# Patient Record
Sex: Female | Born: 1990 | Race: White | Hispanic: No | Marital: Single | Smoking: Never smoker
Health system: Southern US, Community
[De-identification: ages and names within clinical notes are randomized; demographics above are authoritative.]

---
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
## 2012-08-10 ENCOUNTER — Emergency Department: Payer: Self-pay | Admitting: Emergency Medicine

## 2013-09-20 ENCOUNTER — Ambulatory Visit (INDEPENDENT_AMBULATORY_CARE_PROVIDER_SITE_OTHER): Payer: BC Managed Care – PPO | Admitting: Family Medicine

## 2013-09-20 VITALS — BP 116/68 | HR 101 | Temp 98.8°F | Resp 18 | Ht 64.5 in | Wt 122.0 lb

## 2013-09-20 DIAGNOSIS — J0100 Acute maxillary sinusitis, unspecified: Secondary | ICD-10-CM

## 2013-09-20 DIAGNOSIS — R059 Cough, unspecified: Secondary | ICD-10-CM

## 2013-09-20 DIAGNOSIS — R05 Cough: Secondary | ICD-10-CM

## 2013-09-20 DIAGNOSIS — J069 Acute upper respiratory infection, unspecified: Secondary | ICD-10-CM

## 2013-09-20 DIAGNOSIS — F430 Acute stress reaction: Secondary | ICD-10-CM

## 2013-09-20 MED ORDER — AMOXICILLIN-POT CLAVULANATE 875-125 MG PO TABS: 1.0000 | ORAL_TABLET | Freq: Two times a day (BID) | ORAL | Status: DC

## 2013-09-20 MED ORDER — FLUTICASONE PROPIONATE 50 MCG/ACT NA SUSP: 2.0000 | Freq: Every day | NASAL | Status: DC

## 2013-09-20 MED ORDER — PROMETHAZINE-DM 6.25-15 MG/5ML PO SYRP: 5.0000 mL | ORAL_SOLUTION | Freq: Every evening | ORAL | Status: DC | PRN

## 2013-09-20 NOTE — Progress Notes (Signed)
Subjective:    Patient ID: Brittany Khan, female    DOB: 1990/11/03, 23 y.o.   MRN: [id number] This chart was scribed for Ethelda ChickKristi M Damica Gravlin, MD by Valera CastleSteven Perry, ED Scribe. This patient was seen in room 09 and the patient's care was started at 7:07 PM.  Chief Complaint  Patient presents with  . Cough    congestion-all symptoms started Friday  . Sore Throat    HPI Brittany Khan is a 23 y.o. female who [facility] complaining of fever, max temp 99.6, dry cough, sore throat, right ear pain, headache, sinus pressure, rhinorrhea, chills, and body aches, onset 6 days ago. She reports going to the doctor the next day to test for flu. She reports negative flu results, but states they told her she had virus. She reports receiving Tessalon pearls for cough, 1 pill/3xday. She states her coughing has improved during the day, but states that it has still kept her up in the evenings. She reports not being able to fall asleep for several hours after going to bed. She reports her chills and body aches have subsided. She reports taking Advil cold and sinus and Nyquil for her other symptoms, with no relief. She denies congestion, painful swallowing, diarrhea, wheezing, SOB, rash, vomiting, and any other associated symptoms. She denies having flu immunization. She denies h/o allergies. She denies having success with Amoxicillin. She reports doing well with Flonase.   She states she works at [employer]. She reports not doing well with her parents recent divorce. She states she has been hanging in there, but reports feeling sad and stressed. She denies being sad all the time, just when she has time to herself. She denies SI. She thinks that her cold symptoms may be due to the stress. She reports her parents got divorced in 06/2013. She reports seeing [facility] J[facility] Heinz[facility]yl Rosser. She reports living with her father. She states her father tends to be emotionally reserved, and doesn't talk to her much about it.   She reports  she may have Endometrioses. She states she sees Dr. Arelia SneddonMcComb, and reports having US done. She reports having back pain, and being put on Depo Provera, with relief. She reports being on it for about 6 months, stating she will have her 3rd injection this month.   PCP - No PCP Per Patient  There are no active problems to display for this patient.  History reviewed. No pertinent past medical history. Past Surgical History  Procedure Laterality Date  . Knee arthroscopy w/ debridement Right 2012   No Known Allergies Prior to Admission medications   Medication Sig Start Date End Date Taking? Authorizing Provider  benzonatate (TESSALON) 100 MG capsule Take by mouth 3 (three) times daily as needed for cough.   Yes Historical Provider, MD   History   Social History  . Marital Status: Single    Spouse Name: N/A    Number of Children: N/A  . Years of Education: N/A   Occupational History  . Not on file.   Social History Main Topics  . Smoking status: Never Smoker   . Smokeless tobacco: Not on file  . Alcohol Use: No  . Drug Use: No  . Sexual Activity: Not on file   Other Topics Concern  . Not on file   Social History Narrative   Education: on-line courses currently; [place] school in [place] ([employer]); Public affairs consultantgetting vet tech certification.      Employment: working at [employer] 20 hours per week since 2013.    Review of Systems  Constitutional: Positive for fever and chills.  HENT: Positive for ear pain (right), rhinorrhea, sinus pressure and sore throat. Negative for congestion and trouble swallowing.   Respiratory: Positive for cough. Negative for shortness of breath and wheezing.   Gastrointestinal: Negative for vomiting and diarrhea.  Musculoskeletal: Positive for myalgias (body aches). Negative for back pain.  Skin: Negative for rash.  Allergic/Immunologic: Negative.   Neurological: Positive for headaches.  Psychiatric/Behavioral: Positive for sleep  disturbance and dysphoric mood (parents divorce). Negative for suicidal ideas.       Objective:   Physical Exam  Nursing note and vitals reviewed. Constitutional: She is oriented to person, place, and time. She appears well-developed and well-nourished. No distress.  HENT:  Head: Normocephalic and atraumatic.  Right Ear: External ear normal.  Left Ear: External ear normal.  Mouth/Throat: Uvula is midline and mucous membranes are normal. Posterior oropharyngeal erythema (diffuse) present. No oropharyngeal exudate or posterior oropharyngeal edema.  Eyes: EOM are normal.  Neck: Neck supple. No thyromegaly present.  Cardiovascular: Normal rate, regular rhythm and normal heart sounds.   No murmur heard. Pulmonary/Chest: Effort normal and breath sounds normal. No respiratory distress. She has no wheezes. She has no rales.  Musculoskeletal: Normal range of motion.  Lymphadenopathy:    She has cervical adenopathy (mild).  Neurological: She is alert and oriented to person, place, and time.  Skin: Skin is warm and dry.  Psychiatric: She has a normal mood and affect. Her behavior is normal.   BP 116/68  Pulse 101  Temp(Src) 98.8 F (37.1 C) (Oral)  Resp 18  Ht 5' 4.5" (1.638 m)  Wt 122 lb (55.339 kg)  BMI 20.63 kg/m2  SpO2 99%     Assessment & Plan:   1. Acute maxillary sinusitis   2. Acute upper respiratory infections of unspecified site   3. Cough   4. Stress reaction     1. Acute maxillary sinusitis:  New.  Rx for Augmentin, Tessalon Perles, Flonase provided. 2.  URI:  New.  Etiology of sinusitis; rx for [employer], Flonase provided.  Promethazine DM provided as well. 3. Acute stress reaction: New. Coping with stress of parents' divorce; counseling provided.   Meds ordered this encounter  Medications  . DISCONTD: benzonatate (TESSALON) 100 MG capsule    Sig: Take by mouth 3 (three) times daily as needed for cough.  . DISCONTD: amoxicillin-clavulanate (AUGMENTIN)  875-125 MG per tablet    Sig: Take 1 tablet by mouth 2 (two) times daily.    Dispense:  20 tablet    Refill:  0  . fluticasone (FLONASE) 50 MCG/ACT nasal spray    Sig: Place 2 sprays into both nostrils daily.    Dispense:  16 g    Refill:  0  . DISCONTD: promethazine-dextromethorphan (PROMETHAZINE-DM) 6.25-15 MG/5ML syrup    Sig: Take 5 mLs by mouth at bedtime as needed for cough.    Dispense:  120 mL    Refill:  0    I personally performed the services described in this documentation, which was scribed in my presence.  The recorded information has been reviewed and is accurate.  Nilda Simmer, M.D.  [facility] [address] [place], [place]  [address] [phone] phone [phone] fax

## 2013-09-20 NOTE — Patient Instructions (Signed)

## 2013-12-17 ENCOUNTER — Encounter: Payer: BC Managed Care – PPO | Admitting: Family Medicine

## 2013-12-20 ENCOUNTER — Ambulatory Visit (INDEPENDENT_AMBULATORY_CARE_PROVIDER_SITE_OTHER): Payer: BC Managed Care – PPO | Admitting: Family Medicine

## 2013-12-20 ENCOUNTER — Encounter: Payer: Self-pay | Admitting: Family Medicine

## 2013-12-20 VITALS — BP 130/66 | HR 73 | Temp 98.6°F | Resp 16 | Ht 62.0 in | Wt 124.0 lb

## 2013-12-20 DIAGNOSIS — G4700 Insomnia, unspecified: Secondary | ICD-10-CM

## 2013-12-20 DIAGNOSIS — F411 Generalized anxiety disorder: Secondary | ICD-10-CM

## 2013-12-20 DIAGNOSIS — F410 Panic disorder [episodic paroxysmal anxiety] without agoraphobia: Secondary | ICD-10-CM

## 2013-12-20 MED ORDER — TRAZODONE HCL 50 MG PO TABS: 50.0000 mg | ORAL_TABLET | Freq: Every day | ORAL | Status: DC

## 2013-12-20 MED ORDER — ESCITALOPRAM OXALATE 10 MG PO TABS: 10.0000 mg | ORAL_TABLET | Freq: Every day | ORAL | Status: DC

## 2013-12-20 NOTE — Progress Notes (Signed)
Subjective:    Patient ID: Brittany Khan, female    DOB: [birth year], 23 y.o.   MRN: [id number]  Chief Complaint  Patient presents with  . Anxiety  . Stress  . Insomnia   HPI This chart was scribed for Ashlye Oviedo-MD by Smiley HousemanFallon Davis, Scribe. This patient was seen in room 9 and the patient's care was started at 3:42 PM.  HPI Comments: Brittany Khan is a 23 y.o. female who [facility] complaining of gradually worsening insomnia.  Pt reports, "It has been rough lately and is keeping her up at night."  She states she has been staying up to 3:00 AM every night.  She states she then sleeps in till 10:00 AM, unless she has to take her brother to school.  She states she has been extremely moody lately.  Pt's parents separated last year.  She states she lashed out at her mom this week, which is very abnormal for her.  Pt reports her mother recently started dating someone, which makes her mad and upset.    Pt states she has developed anxiety and experiencing anxiety attacks since September 2014.  Pt states the attacks are triggered by loud noise and noises caused by eating.  Pt reports these attacks are causing her to stay at home and isolate herself.  Pt reports these attacks have worsened over time.  She states she was in [place] a few weeks ago and due to the chaos she shut down and couldn't think.  Pt also reports she drinks caffeine and doesn't exercise like she should.  She states she sometimes walks between her parents homes.  Pt reports she is currently working about 20 hours a week at the [employer].  She states she has been working there for almost 2 years.    Pt states she is currently enrolled in online classes at [employer].  Pt states her grades are good and haven't been affected by the stress.  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  Pt states she is stressed about [place] to [place] and  finding an apartment.  She states she will have a roommate and has been in contact with her over the past couple of months.  She denies living alone before.   Pt is currently living with her dad and states they are getting along pretty well.  Pt denies dating anyone.  She states she dated someone for 2 years, but recently ended the relationship.  Pt states it has been difficult dealing with the break-up.  She denies homicidal or suicidal thoughts.       Past Surgical History  Procedure Laterality Date  . Knee arthroscopy w/ debridement Right 2012    Family History  Problem Relation Age of Onset  . Hyperlipidemia Father   . Stroke Maternal Grandmother   . Heart disease Maternal Grandfather   . Cancer Paternal Grandfather     History   Social History  . Marital Status: Single    Spouse Name: N/A    Number of Children: N/A  . Years of Education: N/A   Occupational History  . Not on file.   Social History Main Topics  . Smoking status: Never Smoker   . Smokeless tobacco: Not on file  . Alcohol Use: No  . Drug Use: No  . Sexual Activity: Not on file   Other Topics Concern  .  Not on file   Social History Narrative  . No narrative on file    No Known Allergies  There are no active problems to display for this patient.  Review of Systems  Constitutional: Negative for fever and chills.  Gastrointestinal: Negative for nausea and vomiting.  Skin: Negative for color change and rash.  Neurological: Negative for headaches.  Psychiatric/Behavioral: Positive for sleep disturbance. Negative for suicidal ideas, behavioral problems, confusion and self-injury. The patient is nervous/anxious.     Objective:   Physical Exam  Nursing note and vitals reviewed. Constitutional: She is oriented to person, place, and time. She appears well-developed and well-nourished. No distress.  HENT:  Head: Normocephalic and atraumatic.  Eyes: EOM are normal.  Neck: Neck supple. No tracheal  deviation present. No thyromegaly present.  Cardiovascular: Normal rate, regular rhythm and normal heart sounds.  Exam reveals no gallop and no friction rub.   No murmur heard. Pulmonary/Chest: Effort normal and breath sounds normal. No respiratory distress. She has no wheezes. She has no rales.  Abdominal: Soft. She exhibits no distension.  Musculoskeletal: Normal range of motion.  Lymphadenopathy:    She has no cervical adenopathy.  Neurological: She is alert and oriented to person, place, and time.  Skin: Skin is warm and dry. No rash noted.  Psychiatric: She has a normal mood and affect. Her speech is normal and behavior is normal. Judgment and thought content normal. Cognition and memory are normal. She expresses no homicidal and no suicidal ideation.   Filed Vitals:   12/20/13 1520  BP: 130/66  Pulse: 73  Temp: 98.6 F (37 C)  TempSrc: Oral  Resp: 16  Height: 5\' 2"  (1.575 m)  Weight: 124 lb (56.246 kg)  SpO2: 99%   DIAGNOSTIC STUDIES: Oxygen Saturation is 99% on RA, normal by my interpretation.    COORDINATION OF CARE: 4:03 PM-Patient informed of current plan of treatment and evaluation and agrees with plan.       Assessment & Plan:  Panic attacks  Generalized anxiety disorder  Insomnia  1.  Panic attacks:  New. Secondary to underlying anxiety; rx for Lexapro 10mg  daily provided.  Recommend caffeine avoidance and daily exercise. Will likely warrant up-titration of Lexapro at next visit. 2. Generalized anxiety disorder:  New.  Rx for Lexapro; recommend starting psychotherapy and daily exercise.  Pt to contact therapist. 3.  Insomnia:  New. Rx for Trazodone 50mg  1-2 qhs; avoid caffeine in the evenings.    Meds ordered this encounter  Medications  . escitalopram (LEXAPRO) 10 MG tablet    Sig: Take 1 tablet (10 mg total) by mouth daily.    Dispense:  30 tablet    Refill:  5  . traZODone (DESYREL) 50 MG tablet    Sig: Take 1-2 tablets (50-100 mg total) by mouth at  bedtime.    Dispense:  45 tablet    Refill:  3    I personally performed the services described in this documentation, which was scribed in my presence.  The recorded information has been reviewed and is accurate.  Nilda SimmerKristi Raima Geathers, M.D.  [facility]  [place] [address] [place], [place]  [id number] [phone] phone [phone](336) [phone] fax

## 2013-12-20 NOTE — Patient Instructions (Signed)
1.  Call Elita Quickheryl Lawson for an appointment. 2.  Start walking daily. 3.  Decrease caffeine intake; avoid caffeine after 5:00pm. 4.  Start lexapro 10mg  1/2 tablet daily for two weeks and then increase to 1 tablet daily. 5.  Take Trazodone 50mg  1 at bedtime for insomnia; take one hour before going to bed. If one tablet does not work, you can increase to 2 tablets at bedtime for insomnia. 6.  Call if you have any problems with these medications.  Generalized Anxiety Disorder Generalized anxiety disorder (GAD) is a mental disorder. It interferes with life functions, including relationships, work, and school. GAD is different from normal anxiety, which everyone experiences at some point in their lives in response to specific life events and activities. Normal anxiety actually helps us prepare for and get through these life events and activities. Normal anxiety goes away after the event or activity is over.  GAD causes anxiety that is not necessarily related to specific events or activities. It also causes excess anxiety in proportion to specific events or activities. The anxiety associated with GAD is also difficult to control. GAD can vary from mild to severe. People with severe GAD can have intense waves of anxiety with physical symptoms (panic attacks).  SYMPTOMS The anxiety and worry associated with GAD are difficult to control. This anxiety and worry are related to many life events and activities and also occur more days than not for 6 months or longer. People with GAD also have three or more of the following symptoms (one or more in children):  Restlessness.   Fatigue.  Difficulty concentrating.   Irritability.  Muscle tension.  Difficulty sleeping or unsatisfying sleep. DIAGNOSIS GAD is diagnosed through an assessment by your caregiver. Your caregiver will ask you questions aboutyour mood,physical symptoms, and events in your life. Your caregiver may ask you about your medical history  and use of alcohol or drugs, including prescription medications. Your caregiver may also do a physical exam and blood tests. Certain medical conditions and the use of certain substances can cause symptoms similar to those associated with GAD. Your caregiver may refer you to a mental health specialist for further evaluation. TREATMENT The following therapies are usually used to treat GAD:   Medication Antidepressant medication usually is prescribed for long-term daily control. Antianxiety medications may be added in severe cases, especially when panic attacks occur.   Talk therapy (psychotherapy) Certain types of talk therapy can be helpful in treating GAD by providing support, education, and guidance. A form of talk therapy called cognitive behavioral therapy can teach you healthy ways to think about and react to daily life events and activities.  Stress managementtechniques These include yoga, meditation, and exercise and can be very helpful when they are practiced regularly. A mental health specialist can help determine which treatment is best for you. Some people see improvement with one therapy. However, other people require a combination of therapies. Document Released: 12/18/2012 Document Reviewed: 12/18/2012 [facility] Patient Information 2014 [employer] [place].

## 2013-12-24 NOTE — Progress Notes (Signed)
Left a message for patient to return call for appointment

## 2013-12-26 NOTE — Progress Notes (Signed)
Left a message for patient to return call for 1 month follow up appointment with Dr. Katrinka BlazingSmith,

## 2014-01-14 ENCOUNTER — Encounter: Payer: Self-pay | Admitting: Family Medicine

## 2014-01-14 ENCOUNTER — Ambulatory Visit (INDEPENDENT_AMBULATORY_CARE_PROVIDER_SITE_OTHER): Payer: BC Managed Care – PPO | Admitting: Family Medicine

## 2014-01-14 VITALS — BP 96/60 | HR 60 | Resp 18 | Ht 64.0 in | Wt 118.0 lb

## 2014-01-14 DIAGNOSIS — F410 Panic disorder [episodic paroxysmal anxiety] without agoraphobia: Secondary | ICD-10-CM

## 2014-01-14 DIAGNOSIS — F419 Anxiety disorder, unspecified: Principal | ICD-10-CM

## 2014-01-14 DIAGNOSIS — F341 Dysthymic disorder: Secondary | ICD-10-CM

## 2014-01-14 DIAGNOSIS — F329 Major depressive disorder, single episode, unspecified: Secondary | ICD-10-CM

## 2014-01-14 DIAGNOSIS — F32A Depression, unspecified: Secondary | ICD-10-CM

## 2014-01-14 DIAGNOSIS — G4700 Insomnia, unspecified: Secondary | ICD-10-CM

## 2014-01-14 NOTE — Progress Notes (Signed)
Subjective:  This chart was scribed for  Brittany SimmerKristi Verne Cove, MD  by Ashley JacobsBrittany Andrews, [facility] Scribe. The patient was seen in room and the patient's care was started at 10:09 AM.   Patient ID: Brittany Khan, female    DOB: 23-Jul-1991, 23 y.o.   MRN: [id number]  HPI HPI Comments: Brittany DelCelina N Hazen is a 23 y.o. female who [facility] a follow up after being seen one month ago for anxiety. She reports feeling 15% better since her last visit. Pt was having 2-3 panic attacks a week and now she is having one a week. Pt was prescribed Lexapro 10 mg. She had a panic attack two weeks ago. Pt is staying with her mother and she is less stressed/ "paranoid". [facility] because she is working close to full time. She has intermittent dysphoric moods.Pt feels more energized when taking Lexapro so she takes it in the morning or afternoon. She denies having an appetite. Pt is on the Depo Provera. She lost weight but she reports this occurred the last time she was in a stressful event. The Trazodone helps her to sleep at night but she only takes it when does not have to wake early in the morning. The night when she does not take it she wakes in the middle of the night. Denies SI thought. She has decreased her caffeine intakes. She has caffeine. Pt is active outside and walk her dogs daily. She is moving to [place] and has a new job in either late June or early July.  She is happy about this move.   Review of Systems  Constitutional: Positive for appetite change and unexpected weight change. Negative for fever, chills, diaphoresis and fatigue.  Gastrointestinal: Negative for nausea and vomiting.  Psychiatric/Behavioral: Positive for sleep disturbance. Negative for suicidal ideas, behavioral problems, confusion, self-injury, dysphoric mood, decreased concentration and agitation. The patient is nervous/anxious.        Objective:   Physical Exam    Constitutional: She is oriented to person, place, and time. She appears well-developed and well-nourished. No distress.  HENT:  Head: Normocephalic and atraumatic.  Eyes: Conjunctivae and EOM are normal. Pupils are equal, round, and reactive to light.  Neck: Normal range of motion. Neck supple.  Cardiovascular: Normal rate, regular rhythm and normal heart sounds.  Exam reveals no gallop and no friction rub.   No murmur heard. Pulmonary/Chest: Effort normal and breath sounds normal. No respiratory distress. She has no wheezes. She has no rales.  Abdominal: Soft. Bowel sounds are normal. She exhibits no distension and no mass. There is no tenderness. There is no rebound and no guarding.  Neurological: She is alert and oriented to person, place, and time. No cranial nerve deficit. She exhibits normal muscle tone. Coordination normal.  Skin: Skin is warm and dry. She is not diaphoretic.  Psychiatric: She has a normal mood and affect. Her behavior is normal. Judgment and thought content normal.  Nursing note and vitals reviewed.       Assessment & Plan:  Anxiety and depression  Panic attack  Insomnia   1. Anxiety and depression: improving with Lexapro 10mg  daily; no change in management at this time.  [place] upcoming [place]; recommend establishing with therapist there; also consider establishing with new PCP in [place].  Otherwise, I will be happy to see her when she visits family.  Recommend follow-up one last  time before moving. 2. Panic attacks: improved from last visit; now having one panic attack per week on average. 3. Insomnia: persistent due to sporadic use of Trazodone; encourage regular Trazodone use qhs.   No orders of the defined types were placed in this encounter.   I personally performed the services described in this documentation, which was scribed in my presence.  The recorded information has been reviewed and is accurate.  Brittany SimmerKristi Deyona Soza,  M.D.  [facility]  [place] [address] [place], [place]  [id number] [phone] phone [phone] fax

## 2014-02-13 ENCOUNTER — Ambulatory Visit: Payer: BC Managed Care – PPO | Admitting: Family Medicine

## 2014-02-27 ENCOUNTER — Ambulatory Visit: Payer: BC Managed Care – PPO | Admitting: Family Medicine

## 2014-03-27 ENCOUNTER — Ambulatory Visit: Payer: BC Managed Care – PPO | Admitting: Family Medicine

## 2014-06-04 IMAGING — CT CT HEAD WITHOUT CONTRAST
2 series · 16 of 30 positions shown, 20 images · non-contrast
Comparison: none

REASON FOR EXAM: fall
COMMENTS:

[Series 2: without · axial · non-contrast · 0.40mm/px · z∈[+59,+179]mm · 13 of 29 slices shown, 17 images]
[im 3/29  brain]
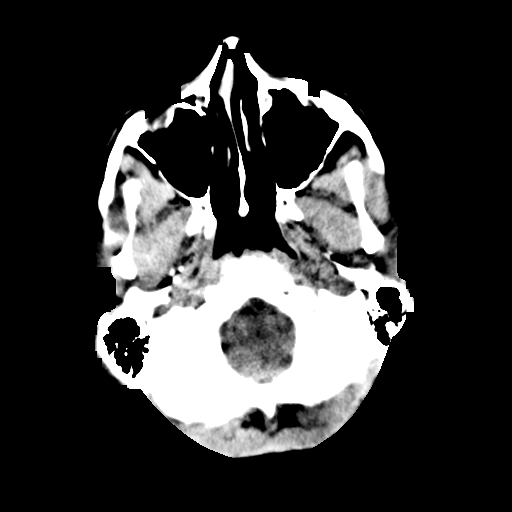
[im 3/29  bone]
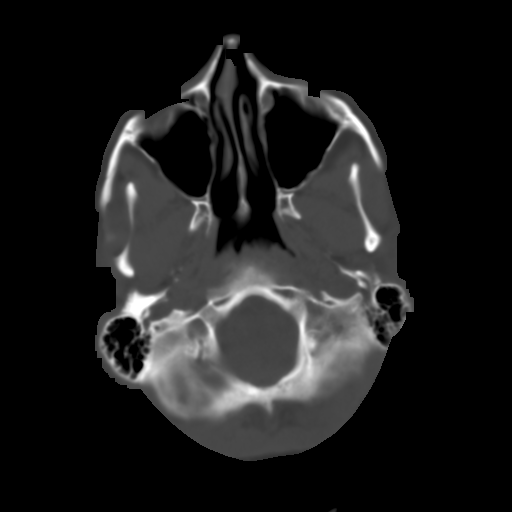
[im 5/29  brain]
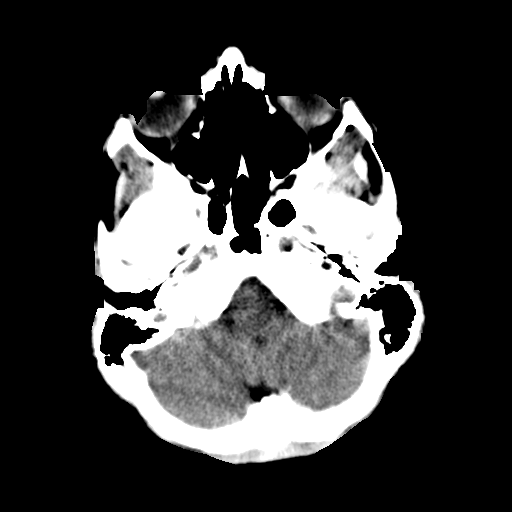
[im 7/29  brain]
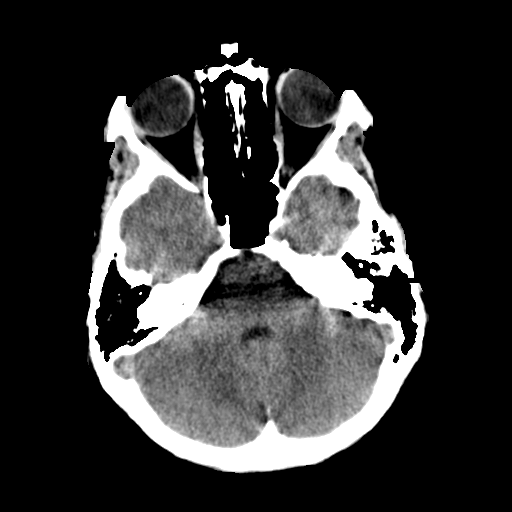
[im 9/29  brain]
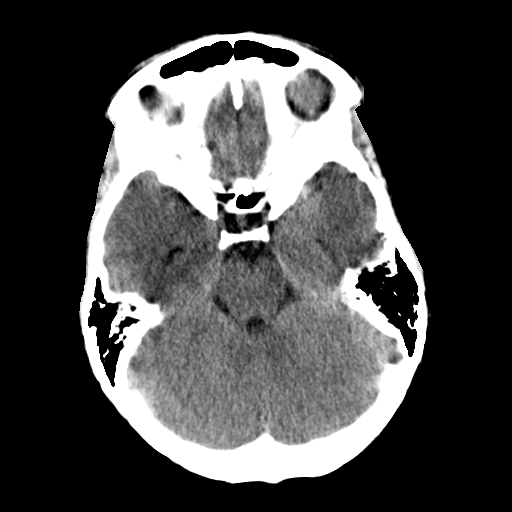
[im 11/29  brain]
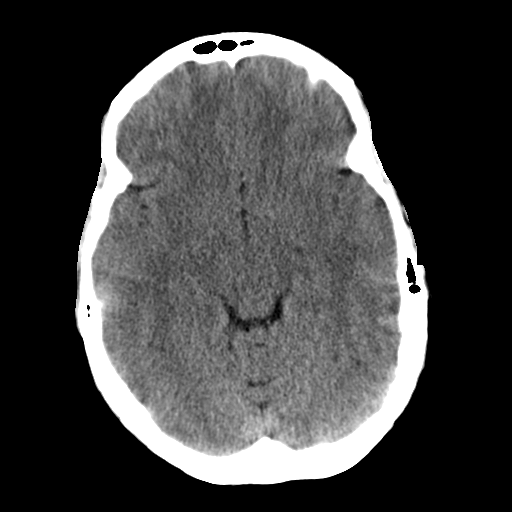
[im 11/29  bone]
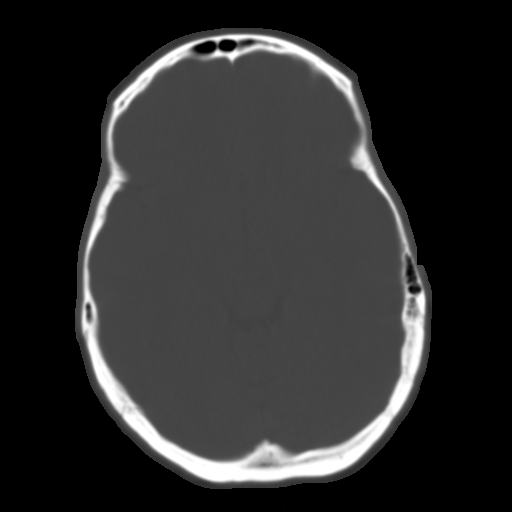
[im 13/29  brain]
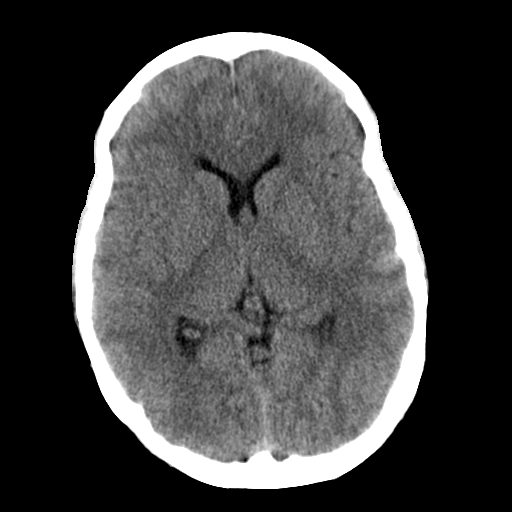
[im 15/29  brain]
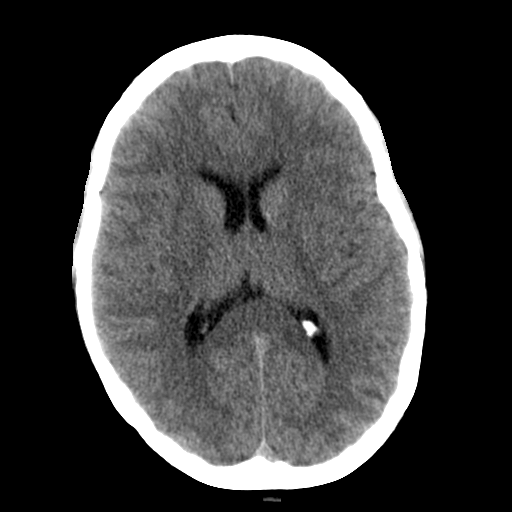
[im 17/29  brain]
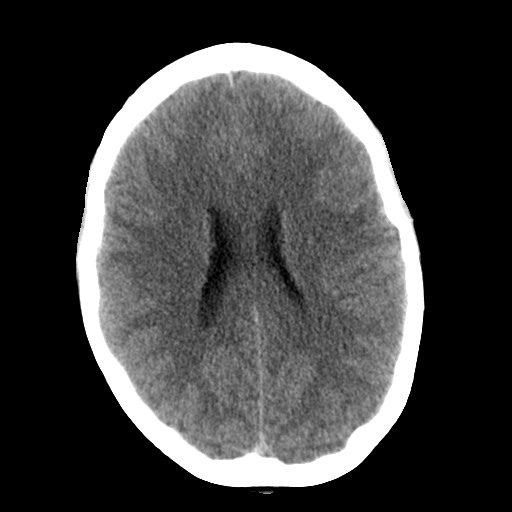
[im 19/29  brain]
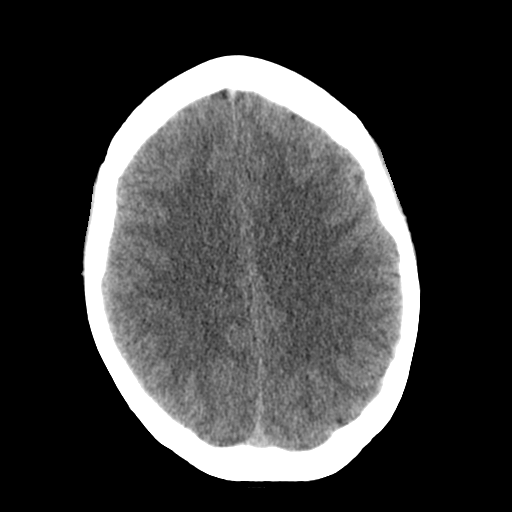
[im 19/29  bone]
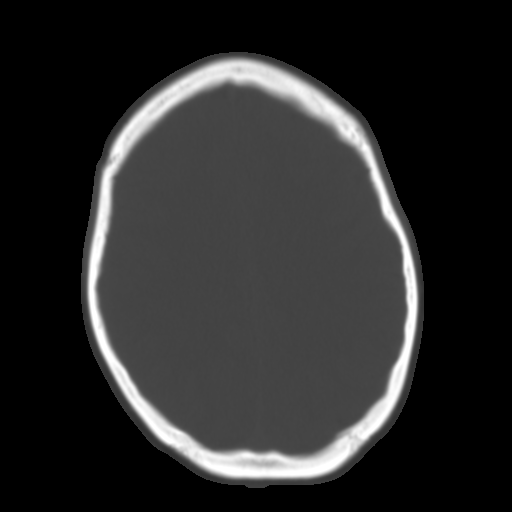
[im 21/29  brain]
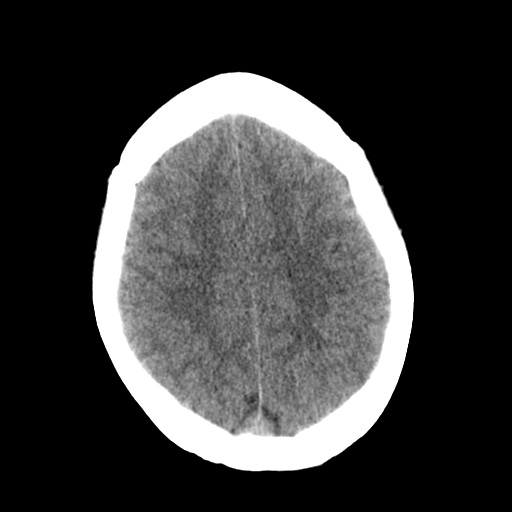
[im 23/29  brain]
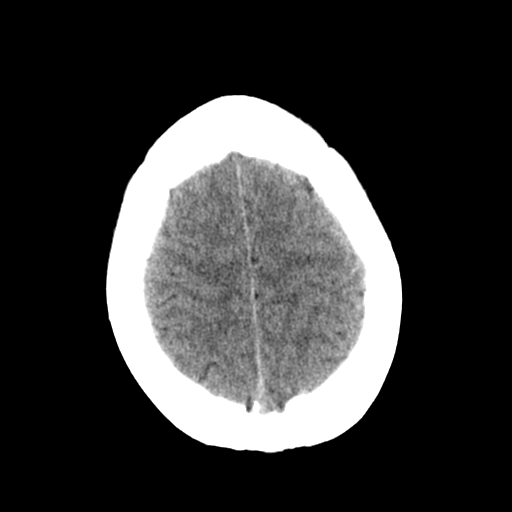
[im 25/29  brain]
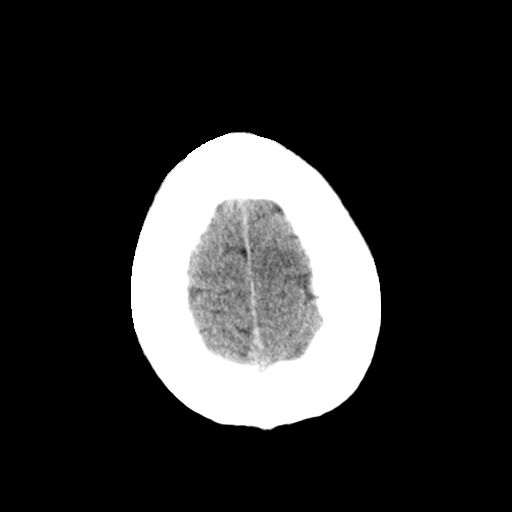
[im 27/29  brain]
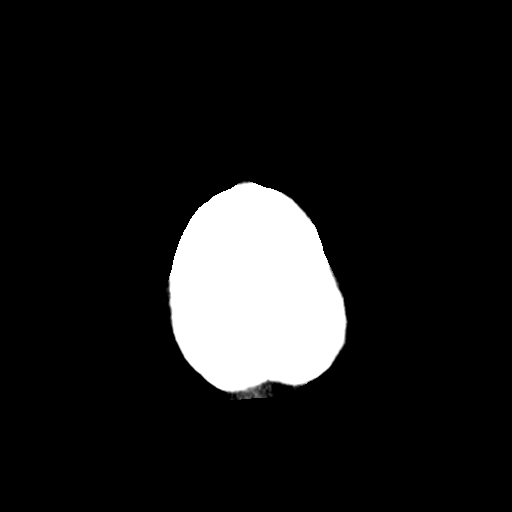
[im 27/29  bone]
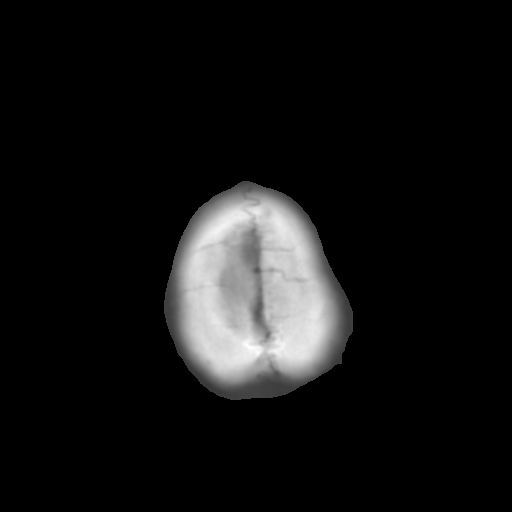

[Series 3: bone · axial · 0.40mm/px · z∈[+59,+99]mm · 3 of 29 slices shown]
[im 3/29  bone]
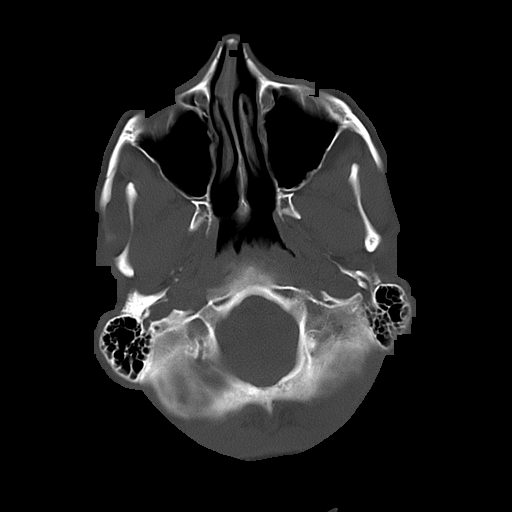
[im 7/29  bone]
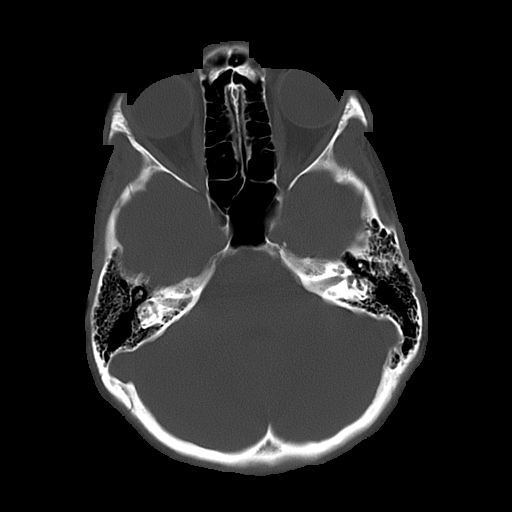
[im 11/29  bone]
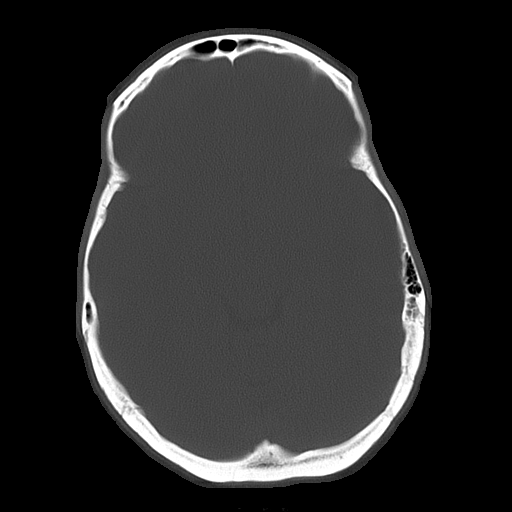

[16 of 30 positions shown; findings below may reference images not displayed]

PROCEDURE:     CT  - CT HEAD WITHOUT CONTRAST  - August 10, 2012  [DATE]

RESULT:     Axial noncontrast CT scanning was performed through the brain
with reconstructions at 5 mm intervals and slice thicknesses.

The ventricles are normal in size and position. There is no intracranial
hemorrhage nor intracranial mass effect. The cerebellum and brainstem are
normal in density. There is no evidence of an evolving ischemic event. At
bone window settings the observed portions of the paranasal sinuses and
mastoid air cells are clear. There is subtle soft tissue density on the
lower most image of the left maxillary sinus but this does not appear
inflammatory or posttraumatic. There is no evidence of an acute skull
fracture.
IMPRESSION: There is no acute intracranial abnormality.

[REDACTED]

## 2015-10-09 ENCOUNTER — Ambulatory Visit (INDEPENDENT_AMBULATORY_CARE_PROVIDER_SITE_OTHER): Payer: BLUE CROSS/BLUE SHIELD | Admitting: Family Medicine

## 2015-10-09 VITALS — BP 120/68 | HR 62 | Temp 99.2°F | Resp 16 | Ht 64.25 in | Wt 129.4 lb

## 2015-10-09 DIAGNOSIS — M545 Low back pain, unspecified: Secondary | ICD-10-CM

## 2015-10-09 DIAGNOSIS — R350 Frequency of micturition: Secondary | ICD-10-CM | POA: Diagnosis not present

## 2015-10-09 DIAGNOSIS — N390 Urinary tract infection, site not specified: Secondary | ICD-10-CM | POA: Diagnosis not present

## 2015-10-09 LAB — POCT WET + KOH PREP
TRICH BY WET PREP: ABSENT
YEAST BY WET PREP: ABSENT
Yeast by KOH: ABSENT

## 2015-10-09 LAB — POCT URINALYSIS DIP (MANUAL ENTRY)
Bilirubin, UA: NEGATIVE
Glucose, UA: NEGATIVE
Ketones, POC UA: NEGATIVE
Nitrite, UA: NEGATIVE
Protein Ur, POC: NEGATIVE
Spec Grav, UA: 1.01
UROBILINOGEN UA: 0.2
pH, UA: 6.5

## 2015-10-09 LAB — POC MICROSCOPIC URINALYSIS (UMFC): Mucus: ABSENT

## 2015-10-09 NOTE — Progress Notes (Signed)
Subjective:    Patient ID: Brittany Khan, female    DOB: 11-04-1990, 25 y.o.   MRN: [id number]  10/09/2015  Back Pain and Urinary Frequency   HPI This 25 y.o. female presents for evaluation of urinary frequency, back pain.  Checked urine at work; loaded with bacteria.  Started antibiotic/Bactrim eight days ago.  Still having back pain.  Fourth UTI since June.   Onset of symptoms early January; onset with hematuria; went to office; placed on Macrobid; stopped macrobid on 09/20/15. One week later, started having urinary frequency, dysuria; returned to doctor; still had UTI; placed on Bactrim at that time.  Has been taking since 09/24/15.  No fever but was taking Naproxen for back pain.  No chills/sweats.  No abdominal pain; mild nausea; no vomiting; no constipation or diarrhea.  No dysuria currently; no current gross hematuria; +frequency is every hour which is an improvement; nocturia x 1.  No vaginal discharge/burning/itching/irritation.  Sexual activity for the past one year.  Urine cultures every time presented to doctor's office but patient has not been notified; presented to Urgent Care in [place].  Back pain ongoing for a while/chronic; acutely worsens with UTI symptoms.  Depoprovera for contraception; just received in September 12, 2015.  [facility]  Has appointment with [facility] in three days.  STD screening three months ago.  Negative testing.    Checks urine culture with each UTI since July 2016.     Review of Systems  Constitutional: Negative for fever, chills, diaphoresis and fatigue.  Genitourinary: Positive for frequency and flank pain. Negative for dysuria, urgency, hematuria, decreased urine volume, vaginal bleeding, vaginal discharge, genital sores, vaginal pain, menstrual problem and pelvic pain.  Musculoskeletal: Positive for back pain.    No past medical history on file. Past Surgical History  Procedure Laterality Date  . Knee arthroscopy w/ debridement Right  2012   No Known Allergies  Social History   Social History  . Marital Status: Single    Spouse Name: N/A  . Number of Children: N/A  . Years of Education: N/A   Occupational History  . Not on file.   Social History Main Topics  . Smoking status: Never Smoker   . Smokeless tobacco: Not on file  . Alcohol Use: No  . Drug Use: No  . Sexual Activity: Yes    Birth Control/ Protection: Injection   Other Topics Concern  . 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      Employment: working at [employer] 20 hours per week since 2013.   Family History  Problem Relation Age of Onset  . Hyperlipidemia Father   . Stroke Maternal Grandmother   . Heart disease Maternal Grandfather   . Cancer Paternal Grandfather        Objective:    BP 120/68 mmHg  Pulse 62  Temp(Src) 99.2 F (37.3 C) (Oral)  Resp 16  Ht 5' 4.25" (1.632 m)  Wt 129 lb 6.4 oz (58.695 kg)  BMI 22.04 kg/m2  SpO2 98% Physical Exam  Constitutional: She is oriented to person, place, and time. She appears well-developed and well-nourished. No distress.  HENT:  Head: Normocephalic and atraumatic.  Eyes: Conjunctivae are normal. Pupils are equal, round, and reactive to light.  Neck: Normal range of motion. Neck supple.  Cardiovascular: Normal rate, regular rhythm and normal heart sounds.  Exam  reveals no gallop and no friction rub.   No murmur heard. Pulmonary/Chest: Effort normal and breath sounds normal. She has no wheezes. She has no rales.  Abdominal: Soft. Bowel sounds are normal. She exhibits no distension and no mass. There is no tenderness. There is no rebound and no guarding.  Genitourinary: Vagina normal and uterus normal. There is no rash, tenderness or lesion on the right labia. There is no rash, tenderness or lesion on the left labia. Cervix exhibits no motion  tenderness, no discharge and no friability. Right adnexum displays no mass, no tenderness and no fullness. Left adnexum displays no mass, no tenderness and no fullness. No vaginal discharge found.  Musculoskeletal:       Lumbar back: She exhibits pain. She exhibits normal range of motion, no tenderness, no bony tenderness and no spasm.  Neurological: She is alert and oriented to person, place, and time.  Skin: She is not diaphoretic.  Psychiatric: She has a normal mood and affect. Her behavior is normal.  Nursing note and vitals reviewed.       Assessment & Plan:   1. Recurrent UTI   2. Urinary frequency   3. Bilateral low back pain without sciatica    -recurrent symptoms of urinary frequency yet benign urine in office.  Send urine culture. -obtain GC/Chlam. -Rx for Naproxen provided for musculoskeletal etiology to lower back pain; recommend stretching, heat, NSAIDs. Avoid heavy llifting.   Orders Placed This Encounter  Procedures  . Urine culture  . GC/Chlamydia Probe Amp  . POCT Microscopic Urinalysis ([facility])  . POCT urinalysis dipstick  . POCT Wet + KOH Prep   Meds ordered this encounter  Medications  . naproxen (NAPROSYN) 500 MG tablet    Sig: Take 500 mg by mouth as needed.    No Follow-up on file.    Kristi Paulita Fujita, M.D. [facility] [address] [place], [place]  [address] [phone] phone [phone] fax

## 2015-10-10 LAB — URINE CULTURE
Colony Count: NO GROWTH
Organism ID, Bacteria: NO GROWTH

## 2015-10-10 LAB — GC/CHLAMYDIA PROBE AMP
CT Probe RNA: NOT DETECTED
GC PROBE AMP APTIMA: NOT DETECTED

## 2015-11-29 ENCOUNTER — Encounter: Payer: Self-pay | Admitting: Family Medicine

## 2017-02-01 ENCOUNTER — Encounter: Payer: Self-pay | Admitting: Family Medicine

## 2017-02-01 ENCOUNTER — Ambulatory Visit (INDEPENDENT_AMBULATORY_CARE_PROVIDER_SITE_OTHER): Payer: BLUE CROSS/BLUE SHIELD | Admitting: Family Medicine

## 2017-02-01 VITALS — BP 123/78 | HR 81 | Temp 99.1°F | Resp 18 | Ht 65.0 in | Wt 141.2 lb

## 2017-02-01 DIAGNOSIS — F99 Mental disorder, not otherwise specified: Secondary | ICD-10-CM | POA: Diagnosis not present

## 2017-02-01 DIAGNOSIS — F5105 Insomnia due to other mental disorder: Secondary | ICD-10-CM

## 2017-02-01 DIAGNOSIS — F411 Generalized anxiety disorder: Secondary | ICD-10-CM

## 2017-02-01 DIAGNOSIS — M6289 Other specified disorders of muscle: Secondary | ICD-10-CM | POA: Diagnosis not present

## 2017-02-01 MED ORDER — TRAZODONE HCL 50 MG PO TABS: 25.0000 mg | ORAL_TABLET | Freq: Every evening | ORAL | 3 refills | Status: DC | PRN

## 2017-02-01 MED ORDER — ESCITALOPRAM OXALATE 10 MG PO TABS: 10.0000 mg | ORAL_TABLET | Freq: Every day | ORAL | 5 refills | Status: DC

## 2017-02-01 NOTE — Progress Notes (Signed)
Subjective:    Patient ID: Brittany Khan, female    DOB: [birth year], 26 y.o.   MRN: [id number]  02/01/2017  Depression/Anxiety (x 1 month, depression scale score 14)   HPI This 26 y.o. female presents for evaluation of anxiety and depression.   Started suffering with anxiety again.  Onset when parents separated and then improved. Disappointed with self professionally and personally.  Career wise does not make enough.  Happy with work; pay is reasonable.  Corporation thus less pay.  Vet tech.  Happy with job; not happy for a while yet dogs ran away and had to give the other to a rescue.  Pt unable to take pets to current living situation; house with roommates.  One dog for six years and the other for four years.  Shared with boyfriend long term plans for relationship.  Has been with boyfriend for 2.5 years.  No response from boyfriend.   Boyfriend about to leave for six months because in the army.    Anxiety occurs randomly during the day; usually devleops chest tightness, SOB; panic attack. Duration all day to two hours.  Nighttime is the worst; not sleeping.  Nighttime awakening.  No sleep aides.  Also occurs in social settings.   Yoga twice per week.  Reality once per week.  Gym tries three times per week; most times per week is twice.  Running/jogging.  Trying to start weight training; goes with roommate.    Roommates are great.  Gay married couple.  Good friends.  Worried may not have time for counseling.  Willing to consider medication. Work schedule 4-5 days per week 7:30-7:00pm.  Medium cup of coffee per day. Lives in [place]; one hour away.  Pelvic floor dysfunction: diagnosed by gynecologist; referred to [facility] due to recurrent UTIs.  No interstitial cystitis; dx with pelvic floor dysfunction; undergoing physical therapy.  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    Wt Readings from Last 3 Encounters:  02/01/17 141 lb 3.2 oz (64 kg)  10/09/15 129 lb 6.4 oz (58.7  kg)  01/14/14 118 lb (53.5 kg)     Review of Systems  Constitutional: Negative for chills, diaphoresis, fatigue and fever.  Eyes: Negative for visual disturbance.  Respiratory: Negative for cough and shortness of breath.   Cardiovascular: Negative for chest pain, palpitations and leg swelling.  Gastrointestinal: Negative for abdominal pain, constipation, diarrhea, nausea and vomiting.  Endocrine: Negative for cold intolerance, heat intolerance, polydipsia, polyphagia and polyuria.  Neurological: Negative for dizziness, tremors, seizures, syncope, facial asymmetry, speech difficulty, weakness, light-headedness, numbness and headaches.  Psychiatric/Behavioral: Positive for dysphoric mood and sleep disturbance. Negative for self-injury. The patient is nervous/anxious.     History reviewed. No pertinent past medical history. Past Surgical History:  Procedure Laterality Date  . KNEE ARTHROSCOPY W/ DEBRIDEMENT Right 2012   No Known Allergies  Social History   Social History  . Marital status: Single    Spouse name: N/A  . Number of children: N/A  . Years of education: N/A   Occupational History  . Not on file.   Social History Main Topics  . Smoking status: Never Smoker  . Smokeless tobacco: Never Used  . Alcohol use No  . Drug use: No  . Sexual activity: Yes    Birth control/ protection: Injection   Other Topics Concern  . 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      Employment: working at [employer] 20 hours per week since 2013.   Family History  Problem Relation Age of Onset  . Hyperlipidemia Father   . Stroke Maternal Grandmother   . Heart disease Maternal Grandfather   . Cancer Paternal Grandfather        Objective:    BP 123/78 (BP Location: Right Arm, Patient Position: Sitting, Cuff Size: Normal)   Pulse 81   Temp  99.1 F (37.3 C) (Oral)   Resp 18   Ht 5\' 5"  (1.651 m)   Wt 141 lb 3.2 oz (64 kg)   SpO2 99%   BMI 23.50 kg/m  Physical Exam  Constitutional: She is oriented to person, place, and time. She appears well-developed and well-nourished. No distress.  HENT:  Head: Normocephalic and atraumatic.  Right Ear: External ear normal.  Left Ear: External ear normal.  Nose: Nose normal.  Mouth/Throat: Oropharynx is clear and moist.  Eyes: Conjunctivae and EOM are normal. Pupils are equal, round, and reactive to light.  Neck: Normal range of motion. Neck supple. Carotid bruit is not present. No thyromegaly present.  Cardiovascular: Normal rate, regular rhythm, normal heart sounds and intact distal pulses.  Exam reveals no gallop and no friction rub.   No murmur heard. Pulmonary/Chest: Effort normal and breath sounds normal. She has no wheezes. She has no rales.  Abdominal: Soft. Bowel sounds are normal. She exhibits no distension and no mass. There is no tenderness. There is no rebound and no guarding.  Lymphadenopathy:    She has no cervical adenopathy.  Neurological: She is alert and oriented to person, place, and time. No cranial nerve deficit. She exhibits normal muscle tone. Coordination normal.  Skin: Skin is warm and dry. No rash noted. She is not diaphoretic. No erythema. No pallor.  Psychiatric: She has a normal mood and affect. Her behavior is normal. Judgment and thought content normal.   Results for orders placed or performed in visit on 10/09/15  Urine culture  Result Value Ref Range   Colony Count NO GROWTH    Organism ID, Bacteria NO GROWTH   GC/Chlamydia Probe Amp  Result Value Ref Range   CT Probe RNA NOT DETECTED    GC Probe RNA NOT DETECTED   POCT Microscopic Urinalysis ([facility])  Result Value Ref Range   WBC,UR,HPF,POC None None WBC/hpf   RBC,UR,HPF,POC None None RBC/hpf   Bacteria None None, Too numerous to count   Mucus Absent Absent   Epithelial Cells, UR Per Microscopy  None None, Too numerous to count cells/hpf  POCT urinalysis dipstick  Result Value Ref Range   Color, UA yellow yellow   Clarity, UA clear clear   Glucose, UA negative negative   Bilirubin, UA negative negative   Ketones, POC UA negative negative   Spec Grav, UA 1.010    Blood, UA trace-intact (A) negative   pH, UA 6.5    Protein Ur, POC negative negative   Urobilinogen, UA 0.2    Nitrite, UA Negative Negative   Leukocytes, UA Trace (A) Negative  POCT Wet + KOH Prep  Result Value Ref Range   Yeast by KOH Absent Present, Absent   Yeast by wet prep Absent Present, Absent   WBC by wet prep Moderate (A) None, Few, Too numerous to count   Clue Cells Wet Prep HPF POC None None, Too numerous to count   Trich by wet prep Absent Present, Absent   Bacteria  Wet Prep HPF POC Moderate (A) None, Few, Too numerous to count   Epithelial Cells By [employer] ([facility]) Few None, Few, Too numerous to count   RBC,UR,HPF,POC None None RBC/hpf   Depression screen [facility] 2/9 02/01/2017 10/09/2015 01/14/2014  Decreased Interest 1 0 -  Down, Depressed, Hopeless 2 0 2  PHQ - 2 Score 3 0 2  Altered sleeping 3 - 3  Tired, decreased energy 3 - 1  Change in appetite 2 - 3  Feeling bad or failure about yourself  2 - 0  Trouble concentrating 0 - 0  Moving slowly or fidgety/restless 1 - 0  Suicidal thoughts 0 - 0  PHQ-9 Score 14 - 9  Difficult doing work/chores Somewhat difficult - -       Assessment & Plan:   1. 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 -continue with regular exercise for stress management; also recommend psychotherapy.   No orders of the defined types were placed in this encounter.  Meds ordered this encounter  Medications  . escitalopram (LEXAPRO) 10 MG tablet    Sig: Take 1 tablet (10 mg total) by mouth at bedtime.    Dispense:  30 tablet    Refill:  5  . traZODone (DESYREL) 50 MG tablet     Sig: Take 0.5-1 tablets (25-50 mg total) by mouth at bedtime as needed for sleep.    Dispense:  30 tablet    Refill:  3    Return in about 6 weeks (around 03/15/2017) for recheck anxiety, insomnia.   Darline Faith Paulita Fujita, M.D. [facility] at [facility] [address] [place], [place]  [address] [phone] phone [phone] fax

## 2017-02-01 NOTE — Patient Instructions (Addendum)
   IF you received an x-ray today, you will receive an invoice from [facility]. Please contact [facility] at [phone] with questions or concerns regarding your invoice.   IF you received labwork today, you will receive an invoice from [facility]. Please contact [facility] at [phone] with questions or concerns regarding your invoice.   Our billing staff will not be able to assist you with questions regarding bills from these companies.  You will be contacted with the lab results as soon as they are available. The fastest way to get your results is to activate your My Chart account. Instructions are located on the last page of this paperwork. If you have not heard from us regarding the results in 2 weeks, please contact this office.      Generalized Anxiety Disorder, Adult Generalized anxiety disorder (GAD) is a mental health disorder. People with this condition constantly worry about everyday events. Unlike normal anxiety, worry related to GAD is not triggered by a specific event. These worries also do not fade or get better with time. GAD interferes with life functions, including relationships, work, and school. GAD can vary from mild to severe. People with severe GAD can have intense waves of anxiety with physical symptoms (panic attacks). What are the causes? The exact cause of GAD is not known. What increases the risk? This condition is more likely to develop in:  Women.  People who have a family history of anxiety disorders.  People who are very shy.  People who experience very stressful life events, such as the death of a loved one.  People who have a very stressful family environment. What are the signs or symptoms? People with GAD often worry excessively about many things in their lives, such as their health and family. They may also be overly concerned about:  Doing well at work.  Being on time.  Natural disasters.  Friendships. Physical  symptoms of GAD include:  Fatigue.  Muscle tension or having muscle twitches.  Trembling or feeling shaky.  Being easily startled.  Feeling like your heart is pounding or racing.  Feeling out of breath or like you cannot take a deep breath.  Having trouble falling asleep or staying asleep.  Sweating.  Nausea, diarrhea, or irritable bowel syndrome (IBS).  Headaches.  Trouble concentrating or remembering facts.  Restlessness.  Irritability. How is this diagnosed? Your health care provider can diagnose GAD based on your symptoms and medical history. You will also have a physical exam. The health care provider will ask specific questions about your symptoms, including how severe they are, when they started, and if they come and go. Your health care provider may ask you about your use of alcohol or drugs, including prescription medicines. Your health care provider may refer you to a mental health specialist for further evaluation. Your health care provider will do a thorough examination and may perform additional tests to rule out other possible causes of your symptoms. To be diagnosed with GAD, a person must have anxiety that:  Is out of his or her control.  Affects several different aspects of his or her life, such as work and relationships.  Causes distress that makes him or her unable to take part in normal activities.  Includes at least three physical symptoms of GAD, such as restlessness, fatigue, trouble concentrating, irritability, muscle tension, or sleep problems. Before your health care provider can confirm a diagnosis of GAD, these symptoms must be present more days than they are not,   and they must last for six months or longer. How is this treated? The following therapies are usually used to treat GAD:  Medicine. Antidepressant medicine is usually prescribed for long-term daily control. Antianxiety medicines may be added in severe cases, especially when panic  attacks occur.  Talk therapy (psychotherapy). Certain types of talk therapy can be helpful in treating GAD by providing support, education, and guidance. Options include:  Cognitive behavioral therapy (CBT). People learn coping skills and techniques to ease their anxiety. They learn to identify unrealistic or negative thoughts and behaviors and to replace them with positive ones.  Acceptance and commitment therapy (ACT). This treatment teaches people how to be mindful as a way to cope with unwanted thoughts and feelings.  Biofeedback. This process trains you to manage your body's response (physiological response) through breathing techniques and relaxation methods. You will work with a therapist while machines are used to monitor your physical symptoms.  Stress management techniques. These include yoga, meditation, and exercise. A mental health specialist can help determine which treatment is best for you. Some people see improvement with one type of therapy. However, other people require a combination of therapies. Follow these instructions at home:  Take over-the-counter and prescription medicines only as told by your health care provider.  Try to maintain a normal routine.  Try to anticipate stressful situations and allow extra time to manage them.  Practice any stress management or self-calming techniques as taught by your health care provider.  Do not punish yourself for setbacks or for not making progress.  Try to recognize your accomplishments, even if they are small.  Keep all follow-up visits as told by your health care provider. This is important. Contact a health care provider if:  Your symptoms do not get better.  Your symptoms get worse.  You have signs of depression, such as:  A persistently sad, cranky, or irritable mood.  Loss of enjoyment in activities that used to bring you joy.  Change in weight or eating.  Changes in sleeping habits.  Avoiding friends or  family members.  Loss of energy for normal tasks.  Feelings of guilt or worthlessness. Get help right away if:  You have serious thoughts about hurting yourself or others. If you ever feel like you may hurt yourself or others, or have thoughts about taking your own life, get help right away. You can go to your nearest emergency department or call:  Your local emergency services (911 in the [place]  A suicide crisis helpline, such as the National Suicide Prevention Lifeline at 1-800-273-8255. This is open 24 hours a day. Summary  Generalized anxiety disorder (GAD) is a mental health disorder that involves worry that is not triggered by a specific event.  People with GAD often worry excessively about many things in their lives, such as their health and family.  GAD may cause physical symptoms such as restlessness, trouble concentrating, sleep problems, frequent sweating, nausea, diarrhea, headaches, and trembling or muscle twitching.  A mental health specialist can help determine which treatment is best for you. Some people see improvement with one type of therapy. However, other people require a combination of therapies. This information is not intended to replace advice given to you by your health care provider. Make sure you discuss any questions you have with your health care provider. Document Released: 12/18/2012 Document Revised: 07/13/2016 Document Reviewed: 07/13/2016 Elsevier Interactive Patient Education  2017 [employer] Inc.  

## 2017-02-27 DIAGNOSIS — F411 Generalized anxiety disorder: Secondary | ICD-10-CM | POA: Insufficient documentation

## 2017-02-27 DIAGNOSIS — M6289 Other specified disorders of muscle: Secondary | ICD-10-CM | POA: Insufficient documentation

## 2017-02-27 DIAGNOSIS — F99 Mental disorder, not otherwise specified: Secondary | ICD-10-CM

## 2017-02-27 DIAGNOSIS — F5105 Insomnia due to other mental disorder: Secondary | ICD-10-CM | POA: Insufficient documentation

## 2017-03-16 ENCOUNTER — Ambulatory Visit: Payer: BLUE CROSS/BLUE SHIELD | Admitting: Family Medicine

## 2017-03-24 ENCOUNTER — Other Ambulatory Visit: Payer: Self-pay | Admitting: *Deleted

## 2017-03-24 DIAGNOSIS — F411 Generalized anxiety disorder: Secondary | ICD-10-CM

## 2017-03-24 MED ORDER — TRAZODONE HCL 50 MG PO TABS: 25.0000 mg | ORAL_TABLET | Freq: Every evening | ORAL | 0 refills | Status: DC | PRN

## 2017-03-24 MED ORDER — ESCITALOPRAM OXALATE 10 MG PO TABS: 10.0000 mg | ORAL_TABLET | Freq: Every day | ORAL | 0 refills | Status: DC

## 2017-08-14 ENCOUNTER — Other Ambulatory Visit: Payer: Self-pay | Admitting: Family Medicine

## 2017-08-14 NOTE — Telephone Encounter (Signed)
Call --- I approved a 30 day supply of Lexapro for patient as she is due for six month follow-up; please schedule office visit with me in upcoming 30 days.

## 2017-11-23 ENCOUNTER — Other Ambulatory Visit: Payer: Self-pay

## 2017-11-23 ENCOUNTER — Encounter: Payer: Self-pay | Admitting: Family Medicine

## 2017-11-23 ENCOUNTER — Ambulatory Visit: Payer: BLUE CROSS/BLUE SHIELD | Admitting: Family Medicine

## 2017-11-23 VITALS — BP 110/60 | HR 83 | Temp 98.0°F | Ht 64.96 in | Wt 166.4 lb

## 2017-11-23 DIAGNOSIS — F411 Generalized anxiety disorder: Secondary | ICD-10-CM | POA: Diagnosis not present

## 2017-11-23 DIAGNOSIS — F5105 Insomnia due to other mental disorder: Secondary | ICD-10-CM | POA: Diagnosis not present

## 2017-11-23 DIAGNOSIS — Z6827 Body mass index (BMI) 27.0-27.9, adult: Secondary | ICD-10-CM

## 2017-11-23 DIAGNOSIS — F99 Mental disorder, not otherwise specified: Secondary | ICD-10-CM | POA: Diagnosis not present

## 2017-11-23 MED ORDER — ESCITALOPRAM OXALATE 5 MG PO TABS: 5.0000 mg | ORAL_TABLET | Freq: Every day | ORAL | 1 refills | Status: AC

## 2017-11-23 MED ORDER — TRAZODONE HCL 50 MG PO TABS: 25.0000 mg | ORAL_TABLET | Freq: Every evening | ORAL | 1 refills | Status: AC | PRN

## 2017-11-23 NOTE — Progress Notes (Signed)
Subjective:    Patient ID: Brittany Khan, female    DOB: 06/18/1991, 27 y.o.   MRN: [id number]  11/23/2017  Anxiety (6 month follow-up )    HPI This 27 y.o. female presents for ten month follow-up of anxiety. Boyfriend is deployed; skypes every week; talks daily; deployed after Christmas. Work has a new job; [employer].  No more corporate. Started on 10/07/17. For the past two weeks, lacking motivation and lazy.  Going to the gym.   Unable to clean house; eating habits have been very bad.   Eating a lot of junk. Gained 30 pounds since October 2018. Overeating as well.  No regular exercise; was doing really well for a few months. Went to a conference and then got busy. At work, goes out to eat; gets semi decent food choices. Will eat a salad, water or coke zero.  No regular soda or sweet tea.   S/p gyneoclogy follow-up yesterday; TSH obtained. Stopped DepoProvera; stopped Depo in November 2018.  Did not get in January 2019.  Took Depo for three years. B: iron has been low, so eating special K with iron, farelife 2%.  Snack: none Lunch: salad or leftovers.  Might have protein, water. Snack: maybe; grapes or celery or chips or queso or donuts. Supper:  Chicken, vegetables, salad; might skip. .  Uses meal prep place to create meals.  Good portions.  Goes to gym after work; started in January late.   Crossfit.     BP Readings from Last 3 Encounters:  11/23/17 110/60  02/01/17 123/78  10/09/15 120/68   Wt Readings from Last 3 Encounters:  11/23/17 166 lb 6.4 oz (75.5 kg)  02/01/17 141 lb 3.2 oz (64 kg)  10/09/15 129 lb 6.4 oz (58.7 kg)   Immunization History  Administered Date(s) Administered  . Influenza, Seasonal, Injecte, Preservative Fre 09/05/2007  . PPD Test 03/15/2014  . Tdap 01/04/2014    Review of Systems  Constitutional: Positive for unexpected weight change. Negative for chills, diaphoresis, fatigue and fever.  Eyes: Negative for visual disturbance.    Respiratory: Negative for cough and shortness of breath.   Cardiovascular: Negative for chest pain, palpitations and leg swelling.  Gastrointestinal: Negative for abdominal pain, constipation, diarrhea, nausea and vomiting.  Endocrine: Negative for cold intolerance, heat intolerance, polydipsia, polyphagia and polyuria.  Neurological: Negative for dizziness, tremors, seizures, syncope, facial asymmetry, speech difficulty, weakness, light-headedness, numbness and headaches.  Psychiatric/Behavioral: Positive for dysphoric mood. The patient is nervous/anxious.     No past medical history on file. Past Surgical History:  Procedure Laterality Date  . KNEE ARTHROSCOPY W/ DEBRIDEMENT Right 2012   No Known Allergies No current outpatient medications on file prior to visit.   No current facility-administered medications on file prior to visit.    Social History   Socioeconomic History  . Marital status: Single    Spouse name: Not on file  . Number of children: Not on file  . Years of education: Not on file  . Highest education level: Not on file  Occupational History  . Not on file  Social Needs  . Financial resource strain: Not on file  . Food insecurity:    Worry: Not on file    Inability: Not on file  . Transportation needs:    Medical: Not on file    Non-medical: Not on file  Tobacco Use  . Smoking status: Never Smoker  . Smokeless tobacco: Never Used  Substance and Sexual Activity  .  Alcohol use: No  . Drug use: No  . Sexual activity: Yes    Birth control/protection: Injection  Lifestyle  . Physical activity:    Days per week: Not on file    Minutes per session: Not on file  . Stress: Not on file  Relationships  . Social connections:    Talks on phone: Not on file    Gets together: Not on file    Attends religious service: Not on file    Active member of club or organization: Not on file    Attends meetings of clubs or organizations: Not on file    Relationship  status: Not on file  . Intimate partner violence:    Fear of current or ex partner: Not on file    Emotionally abused: Not on file    Physically abused: Not on file    Forced sexual activity: Not on file  Other Topics Concern  . Not on file  Social History Narrative   Education: on-line courses currently; starting school in [place] ([employer]); Public affairs consultant.      Employment: working at [employer] 20 hours per week since 2013.   Family History  Problem Relation Age of Onset  . Hyperlipidemia Father   . Stroke Maternal Grandmother   . Heart disease Maternal Grandfather   . Cancer Paternal Grandfather        Objective:    BP 110/60 (BP Location: Left Arm, Patient Position: Sitting, Cuff Size: Normal)   Pulse 83   Temp 98 F (36.7 C) (Oral)   Ht 5' 4.96" (1.65 m)   Wt 166 lb 6.4 oz (75.5 kg)   LMP 10/27/2017   SpO2 100%   BMI 27.72 kg/m  Physical Exam  Constitutional: She is oriented to person, place, and time. She appears well-developed and well-nourished. No distress.  HENT:  Head: Normocephalic and atraumatic.  Right Ear: External ear normal.  Left Ear: External ear normal.  Nose: Nose normal.  Mouth/Throat: Oropharynx is clear and moist.  Eyes: Conjunctivae and EOM are normal. Pupils are equal, round, and reactive to light.  Neck: Normal range of motion. Neck supple. Carotid bruit is not present. No thyromegaly present.  Cardiovascular: Normal rate, regular rhythm, normal heart sounds and intact distal pulses. Exam reveals no gallop and no friction rub.  No murmur heard. Pulmonary/Chest: Effort normal and breath sounds normal. She has no wheezes. She has no rales.  Abdominal: Soft. Bowel sounds are normal. She exhibits no distension and no mass. There is no tenderness. There is no rebound and no guarding.  Lymphadenopathy:    She has no cervical adenopathy.  Neurological: She is alert and oriented to person, place, and  time. No cranial nerve deficit. She exhibits normal muscle tone. Coordination normal.  Skin: Skin is warm and dry. No rash noted. She is not diaphoretic. No erythema. No pallor.  Psychiatric: She has a normal mood and affect. Her behavior is normal. Judgment and thought content normal.   No results found. Depression screen [facility] 2/9 11/23/2017 02/01/2017 10/09/2015 01/14/2014  Decreased Interest 0 1 0 -  Down, Depressed, Hopeless 0 2 0 2  PHQ - 2 Score 0 3 0 2  Altered sleeping - 3 - 3  Tired, decreased energy - 3 - 1  Change in appetite - 2 - 3  Feeling bad or failure about yourself  - 2 - 0  Trouble concentrating - 0 - 0  Moving slowly or fidgety/restless -  1 - 0  Suicidal thoughts - 0 - 0  PHQ-9 Score - 14 - 9  Difficult doing work/chores - Somewhat difficult - -   Fall Risk  11/23/2017 02/01/2017 10/09/2015 01/14/2014  Falls in the past year? No No No No        Assessment & Plan:   1. 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 BMI 27.0-27.9,adult     Worsening anxiety and depression due to deployment of boyfriend.  Counseling provided in office.  Prescribed Lexapro 5 mg at bedtime in addition to trazodone 50 mg 1/2-1 at bedtime as needed.  Encourage daily exercise for stress and anxiety management.  Highly recommend psychotherapy as well on a regular basis.  Overweight: With significant weight gain in the past year. S/p recent labs with gynecology; obtain records. Recommend weight loss, exercise for 30-60 minutes five days per week; recommend 1200 kcal restriction per day with a minimum of 60 grams of protein per day.  Eat 3 meals per day. Do not skip meals. Consider having a protein shake as a meal replacement to aid with eliminating meal skipping. Look for products with <220 calories, <7 gm sugar, and 20-30 gm protein.  Eat breakfast within 2 hours of getting up.   Make  your plate non-starchy vegetables,  protein, and  carbohydrates at lunch and dinner.    Aim for at least 64 oz. of calorie-free beverages daily (water, Crystal Light, diet green tea, etc.). Eliminate any sugary beverages such as regular soda, sweet tea, or fruit juice.   Pay attention to hunger and fullness cues.  Stop eating once you feel satisfied; don't wait until you feel full, stuffed, or sick from eating.  Choose lean meats and low fat/fat free dairy products.  Choose foods high in fiber such as fruits, vegetables, and whole grains (brown rice, whole wheat pasta, whole wheat bread, etc.).  Limit foods with added sugar to <7 gm per serving.  Always eat in the kitchen/dining room.  Never eat in the bedroom or in front of the TV.     No orders of the defined types were placed in this encounter.  Meds ordered this encounter  Medications  . traZODone (DESYREL) 50 MG tablet    Sig: Take 0.5-1 tablets (25-50 mg total) by mouth at bedtime as needed for sleep.    Dispense:  90 tablet    Refill:  1  . escitalopram (LEXAPRO) 5 MG tablet    Sig: Take 1 tablet (5 mg total) by mouth at bedtime.    Dispense:  90 tablet    Refill:  1    Return in about 6 months (around 05/26/2018) for recheck.   Redford Behrle Paulita Fujita, M.D. Primary [facility] at [facility] previously [facility] [address] [place], [place]  [address] [phone] phone [phone] fax

## 2017-11-23 NOTE — Patient Instructions (Addendum)
Goal of 20 grams of protein. [email or web address]  IF you received an x-ray today, you will receive an invoice from [facility]. Please contact [facility] at [phone] with questions or concerns regarding your invoice.   IF you received labwork today, you will receive an invoice from [facility] [place] [facility]. Please contact [facility] at [phone] with questions or concerns regarding your invoice.   Our billing staff will not be able to assist you with questions regarding bills from these companies.  You will be contacted with the lab results as soon as they are available. The fastest way to get your results is to activate your My Chart account. Instructions are located on the last page of this paperwork. If you have not heard from [place] regarding the results in 2 weeks, please contact this office.      Calorie Counting for Weight Loss Calories are units of energy. Your body needs a certain amount of calories from food to keep you going throughout the day. When you eat more calories than your body needs, your body stores the extra calories as fat. When you eat fewer calories than your body needs, your body burns fat to get the energy it needs. Calorie counting means keeping track of how many calories you eat and drink each day. Calorie counting can be helpful if you need to lose weight. If you make sure to eat fewer calories than your body needs, you should lose weight. Ask your health care provider what a healthy weight is for you. For calorie counting to work, you will need to eat the right number of calories in a day in order to lose a healthy amount of weight per week. A dietitian can help you determine how many calories you need in a day and will give you suggestions on how to reach your calorie goal.  A healthy amount of weight to lose per week is usually 1-2 lb (0.5-0.9 kg). This usually means that your daily calorie intake should be reduced by 500-750 calories.  Eating 1,200 - 1,500  calories per day can help most women lose weight.  Eating 1,500 - 1,800 calories per day can help most men lose weight.  What is my plan? My goal is to have __________ calories per day. If I have this many calories per day, I should lose around __________ pounds per week. What do I need to know about calorie counting? In order to meet your daily calorie goal, you will need to:  Find out how many calories are in each food you would like to eat. Try to do this before you eat.  Decide how much of the food you plan to eat.  Write down what you ate and how many calories it had. Doing this is called keeping a food log.  To successfully lose weight, it is important to balance calorie counting with a healthy lifestyle that includes regular activity. Aim for 150 minutes of moderate exercise (such as walking) or 75 minutes of vigorous exercise (such as running) each week. Where do I find calorie information?  The number of calories in a food can be found on a Nutrition Facts label. If a food does not have a Nutrition Facts label, try to look up the calories online or ask your dietitian for help. Remember that calories are listed per serving. If you choose to have more than one serving of a food, you will have to multiply the calories per serving by the amount of servings you plan to eat.  For example, the label on a package of bread might say that a serving size is 1 slice and that there are 90 calories in a serving. If you eat 1 slice, you will have eaten 90 calories. If you eat 2 slices, you will have eaten 180 calories. How do I keep a food log? Immediately after each meal, record the following information in your food log:  What you ate. Don't forget to include toppings, sauces, and other extras on the food.  How much you ate. This can be measured in cups, ounces, or number of items.  How many calories each food and drink had.  The total number of calories in the meal.  Keep your food log  near you, such as in a small notebook in your pocket, or use a mobile app or website. Some programs will calculate calories for you and show you how many calories you have left for the day to meet your goal. What are some calorie counting tips?  Use your calories on foods and drinks that will fill you up and not leave you hungry: ? Some examples of foods that fill you up are nuts and nut butters, vegetables, lean proteins, and high-fiber foods like whole grains. High-fiber foods are foods with more than 5 g fiber per serving. ? Drinks such as sodas, specialty coffee drinks, alcohol, and juices have a lot of calories, yet do not fill you up.  Eat nutritious foods and avoid empty calories. Empty calories are calories you get from foods or beverages that do not have many vitamins or protein, such as candy, sweets, and soda. It is better to have a nutritious high-calorie food (such as an avocado) than a food with few nutrients (such as a bag of chips).  Know how many calories are in the foods you eat most often. This will help you calculate calorie counts faster.  Pay attention to calories in drinks. Low-calorie drinks include water and unsweetened drinks.  Pay attention to nutrition labels for "low fat" or "fat free" foods. These foods sometimes have the same amount of calories or more calories than the full fat versions. They also often have added sugar, starch, or salt, to make up for flavor that was removed with the fat.  Find a way of tracking calories that works for you. Get creative. Try different apps or programs if writing down calories does not work for you. What are some portion control tips?  Know how many calories are in a serving. This will help you know how many servings of a certain food you can have.  Use a measuring cup to measure serving sizes. You could also try weighing out portions on a kitchen scale. With time, you will be able to estimate serving sizes for some foods.  Take  some time to put servings of different foods on your favorite plates, bowls, and cups so you know what a serving looks like.  Try not to eat straight from a bag or box. Doing this can lead to overeating. Put the amount you would like to eat in a cup or on a plate to make sure you are eating the right portion.  Use smaller plates, glasses, and bowls to prevent overeating.  Try not to multitask (for example, watch TV or use your computer) while eating. If it is time to eat, sit down at a table and enjoy your food. This will help you to know when you are full. It will also help you  to be aware of what you are eating and how much you are eating. What are tips for following this plan? Reading food labels  Check the calorie count compared to the serving size. The serving size may be smaller than what you are used to eating.  Check the source of the calories. Make sure the food you are eating is high in vitamins and protein and low in saturated and trans fats. Shopping  Read nutrition labels while you shop. This will help you make healthy decisions before you decide to purchase your food.  Make a grocery list and stick to it. Cooking  Try to cook your favorite foods in a healthier way. For example, try baking instead of frying.  Use low-fat dairy products. Meal planning  Use more fruits and vegetables. Half of your plate should be fruits and vegetables.  Include lean proteins like poultry and fish. How do I count calories when eating out?  Ask for smaller portion sizes.  Consider sharing an entree and sides instead of getting your own entree.  If you get your own entree, eat only half. Ask for a box at the beginning of your meal and put the rest of your entree in it so you are not tempted to eat it.  If calories are listed on the menu, choose the lower calorie options.  Choose dishes that include vegetables, fruits, whole grains, low-fat dairy products, and lean protein.  Choose  items that are boiled, broiled, grilled, or steamed. Stay away from items that are buttered, battered, fried, or served with cream sauce. Items labeled "crispy" are usually fried, unless stated otherwise.  Choose water, low-fat milk, unsweetened iced tea, or other drinks without added sugar. If you want an alcoholic beverage, choose a lower calorie option such as a glass of wine or light beer.  Ask for dressings, sauces, and syrups on the side. These are usually high in calories, so you should limit the amount you eat.  If you want a salad, choose a garden salad and ask for grilled meats. Avoid extra toppings like bacon, cheese, or fried items. Ask for the dressing on the side, or ask for olive oil and vinegar or lemon to use as dressing.  Estimate how many servings of a food you are given. For example, a serving of cooked rice is  cup or about the size of half a baseball. Knowing serving sizes will help you be aware of how much food you are eating at restaurants. The list below tells you how big or small some common portion sizes are based on everyday objects: ? 1 oz-4 stacked dice. ? 3 oz-1 deck of cards. ? 1 tsp-1 die. ? 1 Tbsp- a ping-pong ball. ? 2 Tbsp-1 ping-pong ball. ?  cup- baseball. ? 1 cup-1 baseball. Summary  Calorie counting means keeping track of how many calories you eat and drink each day. If you eat fewer calories than your body needs, you should lose weight.  A healthy amount of weight to lose per week is usually 1-2 lb (0.5-0.9 kg). This usually means reducing your daily calorie intake by 500-750 calories.  The number of calories in a food can be found on a Nutrition Facts label. If a food does not have a Nutrition Facts label, try to look up the calories online or ask your dietitian for help.  Use your calories on foods and drinks that will fill you up, and not on foods and drinks that will leave you hungry.  Use smaller plates, glasses, and bowls to prevent  overeating. This information is not intended to replace advice given to you by your health care provider. Make sure you discuss any questions you have with your health care provider. Document Released: 08/23/2005 Document Revised: 07/23/2016 Document Reviewed: 07/23/2016 Elsevier Interactive Patient Education  [employer]2018 [employer]

## 2018-01-20 ENCOUNTER — Encounter: Payer: Self-pay | Admitting: Family Medicine

## 2018-01-25 ENCOUNTER — Encounter: Payer: Self-pay | Admitting: Family Medicine

## 2018-05-29 ENCOUNTER — Ambulatory Visit: Payer: BLUE CROSS/BLUE SHIELD | Admitting: Family Medicine
